# Patient Record
Sex: Male | Born: 1975 | Race: Black or African American | Hispanic: No | Marital: Married | Smoking: Never smoker
Health system: Southern US, Community
[De-identification: ages and names within clinical notes are randomized; demographics above are authoritative.]

## PROBLEM LIST (undated history)

## (undated) ENCOUNTER — Ambulatory Visit (HOSPITAL_COMMUNITY): Admission: EM | Source: Home / Self Care

## (undated) DIAGNOSIS — E785 Hyperlipidemia, unspecified: Secondary | ICD-10-CM

## (undated) HISTORY — PX: KNEE SURGERY: SHX244

## (undated) HISTORY — PX: ANKLE SURGERY: SHX546

## (undated) HISTORY — DX: Hyperlipidemia, unspecified: E78.5

---

## 2001-02-09 ENCOUNTER — Emergency Department (HOSPITAL_COMMUNITY): Admission: EM | Admit: 2001-02-09 | Discharge: 2001-02-09 | Payer: Self-pay | Admitting: *Deleted

## 2001-02-09 ENCOUNTER — Encounter: Payer: Self-pay | Admitting: Internal Medicine

## 2002-01-18 ENCOUNTER — Emergency Department (HOSPITAL_COMMUNITY): Admission: EM | Admit: 2002-01-18 | Discharge: 2002-01-19 | Payer: Self-pay | Admitting: Emergency Medicine

## 2002-01-19 ENCOUNTER — Encounter: Payer: Self-pay | Admitting: Emergency Medicine

## 2005-12-28 ENCOUNTER — Ambulatory Visit (HOSPITAL_COMMUNITY): Admission: RE | Admit: 2005-12-28 | Discharge: 2005-12-28 | Payer: Self-pay | Admitting: Family Medicine

## 2006-11-28 ENCOUNTER — Emergency Department (HOSPITAL_COMMUNITY): Admission: EM | Admit: 2006-11-28 | Discharge: 2006-11-28 | Payer: Self-pay | Admitting: Emergency Medicine

## 2010-06-25 ENCOUNTER — Emergency Department (HOSPITAL_COMMUNITY)
Admission: EM | Admit: 2010-06-25 | Discharge: 2010-06-26 | Disposition: A | Discharge status: Home / Self Care | Payer: Federal, State, Local not specified - PPO | Attending: Emergency Medicine | Admitting: Emergency Medicine

## 2010-06-25 DIAGNOSIS — IMO0002 Reserved for concepts with insufficient information to code with codable children: Secondary | ICD-10-CM | POA: Insufficient documentation

## 2010-06-25 DIAGNOSIS — X503XXA Overexertion from repetitive movements, initial encounter: Secondary | ICD-10-CM | POA: Insufficient documentation

## 2010-06-25 DIAGNOSIS — Y93B9 Activity, other involving muscle strengthening exercises: Secondary | ICD-10-CM | POA: Insufficient documentation

## 2010-06-25 LAB — URINALYSIS, ROUTINE W REFLEX MICROSCOPIC
Bilirubin Urine: NEGATIVE
Glucose, UA: NEGATIVE mg/dL
Hgb urine dipstick: NEGATIVE
Nitrite: NEGATIVE
Protein, ur: NEGATIVE mg/dL
Specific Gravity, Urine: 1.02 (ref 1.005–1.030)
Urobilinogen, UA: 0.2 mg/dL (ref 0.0–1.0)
pH: 6.5 (ref 5.0–8.0)

## 2011-07-11 ENCOUNTER — Ambulatory Visit (HOSPITAL_COMMUNITY)
Admission: RE | Admit: 2011-07-11 | Discharge: 2011-07-11 | Disposition: A | Discharge status: Home / Self Care | Source: Ambulatory Visit | Attending: Family Medicine | Admitting: Family Medicine

## 2011-07-11 ENCOUNTER — Other Ambulatory Visit (HOSPITAL_COMMUNITY): Payer: Self-pay | Admitting: Family Medicine

## 2011-07-11 DIAGNOSIS — M25561 Pain in right knee: Secondary | ICD-10-CM

## 2011-07-11 DIAGNOSIS — M25562 Pain in left knee: Secondary | ICD-10-CM

## 2011-07-11 DIAGNOSIS — M25569 Pain in unspecified knee: Secondary | ICD-10-CM | POA: Insufficient documentation

## 2011-07-26 ENCOUNTER — Ambulatory Visit (INDEPENDENT_AMBULATORY_CARE_PROVIDER_SITE_OTHER): Admitting: Orthopedic Surgery

## 2011-07-26 ENCOUNTER — Encounter: Payer: Self-pay | Admitting: Orthopedic Surgery

## 2011-07-26 VITALS — BP 106/70 | Ht 74.0 in | Wt 246.0 lb

## 2011-07-26 DIAGNOSIS — S83242A Other tear of medial meniscus, current injury, left knee, initial encounter: Secondary | ICD-10-CM

## 2011-07-26 DIAGNOSIS — M2340 Loose body in knee, unspecified knee: Secondary | ICD-10-CM

## 2011-07-26 DIAGNOSIS — IMO0002 Reserved for concepts with insufficient information to code with codable children: Secondary | ICD-10-CM

## 2011-07-26 NOTE — Progress Notes (Signed)
  Subjective:    Tony Franklin is a 36 y.o. male who presents history of gradual onset of sharp gentle 7/10. Intermittent knee pain associated with catching, locking, and giving out of the knee. The patient has had 2 episodes where the knee popped out of joint. The 1st episode started when he got up out of a chair. The 2nd episode occurred later, without any stress on the knee.  Previous treatment include Celebrex, and therapeutic exercise.  Review of systems is remarkable for joint pain with instability, muscle, pain with coughing, seasonal allergy.   The following portions of the patient's history were reviewed and updated as appropriate: allergies, current medications, past family history, past medical history, past social history, past surgical history and problem list.   Review of Systems Pertinent items are noted in HPI.   Objective:    BP 106/70  Ht 6\' 2"  (1.88 m)  Wt 246 lb (111.585 kg)  BMI 31.58 kg/m2  Physical Exam(12) GENERAL: normal development   CDV: pulses are normal   Skin: normal  Lymph: nodes were not palpable/normal  Psychiatric: awake, alert and oriented  Neuro: normal sensation  Upper extremity exam  Inspection and palpation revealed no abnormalities in the upper extremities.  Range of motion is full without contracture.  Motor exam is normal with grade 5 strength.  The joints are fully reduced without subluxation.  There is no atrophy or tremor and muscle tone is normal.  All joints are stable.     Right knee: normal and no effusion, full active range of motion, no joint line tenderness, ligamentous structures intact.  Left knee:  positive exam findings: crepitus, medial joint line tenderness and tenderness noted medial and negative exam findings: no effusion, no erythema, ACL stable, PCL stable, MCL stable, LCL stable, no patellar laxity, McMurray's negative and FROM   X-ray left knee: no fracture, dislocation, swelling or degenerative  changes noted    Assessment:    Left medial meniscal tear versus loose body    Plan:    MRI.

## 2011-07-26 NOTE — Patient Instructions (Addendum)
Schedule patient for LEFT Knee MRI diagnosis loose body versus medial meniscal tear  Start Osteo Bi-Flex as directed on package  Did not perform the PT test until knee has been evaluated by MRI and followup, plus or minus surgery has been completed

## 2011-08-04 ENCOUNTER — Telehealth: Payer: Self-pay | Admitting: Radiology

## 2011-08-04 NOTE — Telephone Encounter (Signed)
Patient has an MRI appointment at [facility] on 08-15-11 at 9:45. Patient has [employer], no precert is needed per Harmon Dun. Patient will follow up here for his results.

## 2011-08-15 ENCOUNTER — Ambulatory Visit: Admitting: Orthopedic Surgery

## 2011-11-21 ENCOUNTER — Other Ambulatory Visit (HOSPITAL_COMMUNITY): Payer: Self-pay | Admitting: Family Medicine

## 2011-11-21 ENCOUNTER — Ambulatory Visit (HOSPITAL_COMMUNITY)
Admission: RE | Admit: 2011-11-21 | Discharge: 2011-11-21 | Disposition: A | Discharge status: Home / Self Care | Source: Ambulatory Visit | Attending: Family Medicine | Admitting: Family Medicine

## 2011-11-21 DIAGNOSIS — X58XXXA Exposure to other specified factors, initial encounter: Secondary | ICD-10-CM | POA: Insufficient documentation

## 2011-11-21 DIAGNOSIS — S92919A Unspecified fracture of unspecified toe(s), initial encounter for closed fracture: Secondary | ICD-10-CM | POA: Insufficient documentation

## 2011-11-21 DIAGNOSIS — T148XXA Other injury of unspecified body region, initial encounter: Secondary | ICD-10-CM

## 2012-01-25 ENCOUNTER — Encounter: Payer: Self-pay | Admitting: Orthopedic Surgery

## 2014-05-06 ENCOUNTER — Emergency Department (HOSPITAL_COMMUNITY)
Admission: EM | Admit: 2014-05-06 | Discharge: 2014-05-06 | Disposition: A | Discharge status: Home / Self Care | Attending: Emergency Medicine | Admitting: Emergency Medicine

## 2014-05-06 ENCOUNTER — Encounter (HOSPITAL_COMMUNITY): Payer: Self-pay

## 2014-05-06 DIAGNOSIS — R1110 Vomiting, unspecified: Secondary | ICD-10-CM | POA: Diagnosis present

## 2014-05-06 DIAGNOSIS — E785 Hyperlipidemia, unspecified: Secondary | ICD-10-CM | POA: Diagnosis not present

## 2014-05-06 DIAGNOSIS — K529 Noninfective gastroenteritis and colitis, unspecified: Secondary | ICD-10-CM | POA: Insufficient documentation

## 2014-05-06 LAB — CBC WITH DIFFERENTIAL/PLATELET
BASOS ABS: 0 10*3/uL (ref 0.0–0.1)
BASOS PCT: 0 % (ref 0–1)
EOS PCT: 0 % (ref 0–5)
Eosinophils Absolute: 0 10*3/uL (ref 0.0–0.7)
HEMATOCRIT: 43 % (ref 39.0–52.0)
HEMOGLOBIN: 14.5 g/dL (ref 13.0–17.0)
LYMPHS ABS: 0.2 10*3/uL — AB (ref 0.7–4.0)
Lymphocytes Relative: 2 % — ABNORMAL LOW (ref 12–46)
MCH: 28.7 pg (ref 26.0–34.0)
MCHC: 33.7 g/dL (ref 30.0–36.0)
MCV: 85 fL (ref 78.0–100.0)
MONO ABS: 0.5 10*3/uL (ref 0.1–1.0)
Monocytes Relative: 6 % (ref 3–12)
NEUTROS PCT: 92 % — AB (ref 43–77)
Neutro Abs: 8.3 10*3/uL — ABNORMAL HIGH (ref 1.7–7.7)
Platelets: 166 10*3/uL (ref 150–400)
RBC: 5.06 MIL/uL (ref 4.22–5.81)
RDW: 13.7 % (ref 11.5–15.5)
WBC: 9.1 10*3/uL (ref 4.0–10.5)

## 2014-05-06 LAB — BASIC METABOLIC PANEL
ANION GAP: 6 (ref 5–15)
BUN: 18 mg/dL (ref 6–23)
CALCIUM: 8.9 mg/dL (ref 8.4–10.5)
CO2: 27 mmol/L (ref 19–32)
Chloride: 104 mmol/L (ref 96–112)
Creatinine, Ser: 1.16 mg/dL (ref 0.50–1.35)
GFR calc non Af Amer: 77 mL/min — ABNORMAL LOW (ref >90)
GFR, EST AFRICAN AMERICAN: 90 mL/min — AB (ref >90)
Glucose, Bld: 136 mg/dL — ABNORMAL HIGH (ref 70–99)
Potassium: 4.1 mmol/L (ref 3.5–5.1)
SODIUM: 137 mmol/L (ref 135–145)

## 2014-05-06 MED ORDER — ONDANSETRON 4 MG PO TBDP: ORAL_TABLET | ORAL | Status: AC

## 2014-05-06 MED ORDER — ONDANSETRON HCL 4 MG/2ML IJ SOLN
4.0000 mg | Freq: Once | INTRAMUSCULAR | Status: AC
  Administered 2014-05-06: 4 mg via INTRAVENOUS
  Filled 2014-05-06: qty 2

## 2014-05-06 MED ORDER — SODIUM CHLORIDE 0.9 % IV BOLUS (SEPSIS)
1000.0000 mL | Freq: Once | INTRAVENOUS | Status: AC
  Administered 2014-05-06: 1000 mL via INTRAVENOUS

## 2014-05-06 MED ORDER — SIMETHICONE 40 MG/0.6ML PO SUSP
ORAL | Status: AC
  Filled 2014-05-06: qty 1.8

## 2014-05-06 MED ORDER — KETOROLAC TROMETHAMINE 30 MG/ML IJ SOLN
30.0000 mg | Freq: Once | INTRAMUSCULAR | Status: AC
  Administered 2014-05-06: 30 mg via INTRAVENOUS
  Filled 2014-05-06: qty 1

## 2014-05-06 NOTE — ED Provider Notes (Signed)
CSN: [id number]     Arrival date & time 05/06/14  0510 History   First MD Initiated Contact with Patient 05/06/14 [phone]     Chief Complaint  Patient presents with  . Emesis     (Consider location/radiation/quality/duration/timing/severity/associated sxs/prior Treatment) Patient is a 39 y.o. male presenting with vomiting. The history is provided by the patient (pt complains of vomiting,  diarrhea and abd cramps for one day).  Emesis Severity:  Moderate Timing:  Intermittent Quality:  Bilious material Able to tolerate:  Liquids Progression:  Unchanged Chronicity:  New Recent urination:  Normal Context: not post-tussive   Associated symptoms: abdominal pain and diarrhea   Associated symptoms: no headaches     Past Medical History  Diagnosis Date  . Hyperlipidemia    Past Surgical History  Procedure Laterality Date  . Ankle surgery     Family History  Problem Relation Age of Onset  . Heart disease    . Arthritis    . Cancer     History  Substance Use Topics  . Smoking status: Never Smoker   . Smokeless tobacco: Not on file  . Alcohol Use: No    Review of Systems  Constitutional: Negative for appetite change and fatigue.  HENT: Negative for congestion, ear discharge and sinus pressure.   Eyes: Negative for discharge.  Respiratory: Negative for cough.   Cardiovascular: Negative for chest pain.  Gastrointestinal: Positive for vomiting, abdominal pain and diarrhea.  Genitourinary: Negative for frequency and hematuria.  Musculoskeletal: Negative for back pain.  Skin: Negative for rash.  Neurological: Negative for seizures and headaches.  Psychiatric/Behavioral: Negative for hallucinations.      Allergies  Review of patient's allergies indicates no known allergies.  Home Medications   Prior to Admission medications   Medication Sig Start Date End Date Taking? Authorizing Provider  Rosuvastatin Calcium (CRESTOR PO) Take by mouth.   Yes Historical Provider, MD   Celecoxib (CELEBREX PO) Take by mouth.    Historical Provider, MD  ondansetron (ZOFRAN ODT) 4 MG disintegrating tablet  ODT q4 hours prn nausea/vomit 05/06/14   Benny Lennert, MD   BP 125/82 mmHg  Pulse 97  Temp(Src) 98.3 F (36.8 C) (Oral)  Resp 20  Ht  (1.905 m)  Wt 250 lb (113.399 kg)  BMI 31.25 kg/m2  SpO2 95% Physical Exam  Constitutional: He is oriented to person, place, and time. He appears well-developed.  HENT:  Head: Normocephalic.  Eyes: Conjunctivae and EOM are normal. No scleral icterus.  Neck: Neck supple. No thyromegaly present.  Cardiovascular: Normal rate and regular rhythm.  Exam reveals no gallop and no friction rub.   No murmur heard. Pulmonary/Chest: No stridor. He has no wheezes. He has no rales. He exhibits no tenderness.  Abdominal: He exhibits no distension. There is tenderness. There is no rebound.  Mild abd tender  Musculoskeletal: Normal range of motion. He exhibits no edema.  Lymphadenopathy:    He has no cervical adenopathy.  Neurological: He is oriented to person, place, and time. He exhibits normal muscle tone. Coordination normal.  Skin: No rash noted. No erythema.  Psychiatric: He has a normal mood and affect. His behavior is normal.    ED Course  Procedures (including critical care time) Labs Review Labs Reviewed  CBC WITH DIFFERENTIAL/PLATELET - Abnormal; Notable for the following:    Neutrophils Relative % 92 (*)    Neutro Abs 8.3 (*)    Lymphocytes Relative 2 (*)    Lymphs Abs 0.2 (*)  All other components within normal limits  BASIC METABOLIC PANEL - Abnormal; Notable for the following:    Glucose, Bld 136 (*)    GFR calc non Af Amer 77 (*)    GFR calc Af Amer 90 (*)    All other components within normal limits    Imaging Review No results found.   EKG Interpretation None      MDM   Final diagnoses:  Gastroenteritis    Gastroenteritis,   tx with zofran, imodium, tylenol and follow up    Benny LennertJoseph L  Sayana Salley, MD 05/06/14 [phone]

## 2014-05-06 NOTE — ED Notes (Signed)
Pt states his son had a stomach bug and he thinks he may have it now, vomiting and diarrhea x2

## 2014-05-06 NOTE — Discharge Instructions (Signed)
Drink plenty of fluids.   Tylenol or motrin for abdominal cramps.  Imodium for severe diarrhea.  Follow up as needed

## 2015-03-16 ENCOUNTER — Encounter (HOSPITAL_COMMUNITY): Payer: Self-pay | Admitting: *Deleted

## 2015-03-16 ENCOUNTER — Emergency Department (HOSPITAL_COMMUNITY)
Admission: EM | Admit: 2015-03-16 | Discharge: 2015-03-17 | Disposition: A | Discharge status: Home / Self Care | Attending: Emergency Medicine | Admitting: Emergency Medicine

## 2015-03-16 DIAGNOSIS — E785 Hyperlipidemia, unspecified: Secondary | ICD-10-CM | POA: Insufficient documentation

## 2015-03-16 DIAGNOSIS — R109 Unspecified abdominal pain: Secondary | ICD-10-CM | POA: Insufficient documentation

## 2015-03-16 DIAGNOSIS — R110 Nausea: Secondary | ICD-10-CM | POA: Diagnosis not present

## 2015-03-16 LAB — CBC WITH DIFFERENTIAL/PLATELET
BASOS PCT: 0 %
Basophils Absolute: 0 10*3/uL (ref 0.0–0.1)
Eosinophils Absolute: 0 10*3/uL (ref 0.0–0.7)
Eosinophils Relative: 0 %
HEMATOCRIT: 41.6 % (ref 39.0–52.0)
HEMOGLOBIN: 14 g/dL (ref 13.0–17.0)
LYMPHS ABS: 1.5 10*3/uL (ref 0.7–4.0)
Lymphocytes Relative: 22 %
MCH: 28.4 pg (ref 26.0–34.0)
MCHC: 33.7 g/dL (ref 30.0–36.0)
MCV: 84.4 fL (ref 78.0–100.0)
MONOS PCT: 6 %
Monocytes Absolute: 0.4 10*3/uL (ref 0.1–1.0)
NEUTROS ABS: 5 10*3/uL (ref 1.7–7.7)
Neutrophils Relative %: 72 %
Platelets: 194 10*3/uL (ref 150–400)
RBC: 4.93 MIL/uL (ref 4.22–5.81)
RDW: 13.6 % (ref 11.5–15.5)
WBC: 7 10*3/uL (ref 4.0–10.5)

## 2015-03-16 MED ORDER — FAMOTIDINE 20 MG PO TABS
20.0000 mg | ORAL_TABLET | Freq: Once | ORAL | Status: AC
  Administered 2015-03-16: 20 mg via ORAL
  Filled 2015-03-16: qty 1

## 2015-03-16 MED ORDER — ONDANSETRON 4 MG PO TBDP
4.0000 mg | ORAL_TABLET | Freq: Once | ORAL | Status: AC
  Administered 2015-03-16: 4 mg via ORAL
  Filled 2015-03-16: qty 1

## 2015-03-16 NOTE — ED Provider Notes (Signed)
CSN: [id number]     Arrival date & time 03/16/15  2228 History   First MD Initiated Contact with Patient 03/16/15 2311     Chief Complaint  Patient presents with  . Abdominal Cramping     (Consider location/radiation/quality/duration/timing/severity/associated sxs/prior Treatment) Patient is a 39 y.o. male presenting with cramps. The history is provided by the patient.  Abdominal Cramping This is a new problem. The current episode started today. The problem occurs intermittently. The problem has been gradually worsening. Associated symptoms include abdominal pain and nausea. Pertinent negatives include no chills, fatigue, fever or headaches. Exacerbated by: home made candy. He has tried nothing for the symptoms. The treatment provided no relief.    Past Medical History  Diagnosis Date  . Hyperlipidemia    Past Surgical History  Procedure Laterality Date  . Ankle surgery     Family History  Problem Relation Age of Onset  . Heart disease    . Arthritis    . Cancer     Social History  Substance Use Topics  . Smoking status: Never Smoker   . Smokeless tobacco: None  . Alcohol Use: No    Review of Systems  Constitutional: Negative for fever, chills and fatigue.  Gastrointestinal: Positive for nausea and abdominal pain.  Neurological: Negative for headaches.  All other systems reviewed and are negative.     Allergies  Review of patient's allergies indicates no known allergies.  Home Medications   Prior to Admission medications   Medication Sig Start Date End Date Taking? Authorizing Provider  Celecoxib (CELEBREX PO) Take by mouth.    Historical Provider, MD  ondansetron (ZOFRAN ODT) 4 MG disintegrating tablet 4mg  ODT q4 hours prn nausea/vomit 05/06/14   Bethann BerkshireJoseph Zammit, MD  Rosuvastatin Calcium (CRESTOR PO) Take by mouth.    Historical Provider, MD   BP 138/92 mmHg  Pulse 63  Temp(Src) 97.9 F (36.6 C) (Oral)  Resp 18  Ht 6\' 3"  (1.905 m)  Wt 117.935 kg  BMI 32.50  kg/m2  SpO2 100% Physical Exam  Constitutional: He is oriented to person, place, and time. He appears well-developed and well-nourished.  Non-toxic appearance.  HENT:  Head: Normocephalic.  Right Ear: Tympanic membrane and external ear normal.  Left Ear: Tympanic membrane and external ear normal.  Eyes: EOM and lids are normal. Pupils are equal, round, and reactive to light.  Neck: Normal range of motion. Neck supple. Carotid bruit is not present.  Cardiovascular: Normal rate, regular rhythm, normal heart sounds, intact distal pulses and normal pulses.   Pulmonary/Chest: Breath sounds normal. No respiratory distress.  Abdominal: Soft. Bowel sounds are normal. There is no tenderness. There is no guarding.  Abdominal soreness present. No distention. No Mass. No CVAT.  Musculoskeletal: Normal range of motion.  Lymphadenopathy:       Head (right side): No submandibular adenopathy present.       Head (left side): No submandibular adenopathy present.    He has no cervical adenopathy.  Neurological: He is alert and oriented to person, place, and time. He has normal strength. No cranial nerve deficit or sensory deficit.  Skin: Skin is warm and dry.  Psychiatric: He has a normal mood and affect. His speech is normal.  Nursing note and vitals reviewed.   ED Course  Procedures (including critical care time) Labs Review Labs Reviewed - No data to display  Imaging Review No results found. I have personally reviewed and evaluated these images and lab results as part of my  medical decision-making.   EKG Interpretation None      MDM  Vital signs are well within normal limits. The complete blood count is well within normal limits. The competence of metabolic panel is nonacute. The lipase is also normal. Patient is feeling some better after Pepcid and Zofran. I have reviewed the findings on examination as well as the laboratory findings with the patient in terms which he understands.  Prescription for Zofran, Pepcid, and Protonix given to the patient. The patient is to follow-up with the GI specialist as soon as possible for additional evaluation concerning his stomach discomfort. Patient is in agreement with this discharge plan.    Final diagnoses:  None    *I have reviewed nursing notes, vital signs, and all appropriate lab and imaging results for this patient.[address] PA-C 03/18/15 1821  Dione Booze, MD 03/22/15 1455

## 2015-03-16 NOTE — ED Notes (Signed)
Pt reporting abdominal cramping that began about 10 this morning.  Reporting some nausea, no vomiting.  Denies diarrhea. Reports that he believes that he got food poisoning.

## 2015-03-17 LAB — COMPREHENSIVE METABOLIC PANEL
ALBUMIN: 4.3 g/dL (ref 3.5–5.0)
ALK PHOS: 60 U/L (ref 38–126)
ALT: 19 U/L (ref 17–63)
ANION GAP: 9 (ref 5–15)
AST: 25 U/L (ref 15–41)
BILIRUBIN TOTAL: 1.2 mg/dL (ref 0.3–1.2)
BUN: 15 mg/dL (ref 6–20)
CALCIUM: 9.4 mg/dL (ref 8.9–10.3)
CO2: 27 mmol/L (ref 22–32)
Chloride: 103 mmol/L (ref 101–111)
Creatinine, Ser: 1 mg/dL (ref 0.61–1.24)
GFR calc Af Amer: >60 mL/min (ref >60)
GFR calc non Af Amer: >60 mL/min (ref >60)
GLUCOSE: 123 mg/dL — AB (ref 65–99)
Potassium: 3.9 mmol/L (ref 3.5–5.1)
Sodium: 139 mmol/L (ref 135–145)
TOTAL PROTEIN: 7.9 g/dL (ref 6.5–8.1)

## 2015-03-17 LAB — LIPASE, BLOOD: Lipase: 25 U/L (ref 11–51)

## 2015-03-17 MED ORDER — ONDANSETRON HCL 4 MG PO TABS: 4.0000 mg | ORAL_TABLET | Freq: Four times a day (QID) | ORAL | Status: DC

## 2015-03-17 MED ORDER — FAMOTIDINE 40 MG PO TABS: 40.0000 mg | ORAL_TABLET | Freq: Every day | ORAL | Status: AC

## 2015-03-17 MED ORDER — PANTOPRAZOLE SODIUM 20 MG PO TBEC: 20.0000 mg | DELAYED_RELEASE_TABLET | Freq: Every day | ORAL | Status: AC

## 2015-03-17 NOTE — Discharge Instructions (Signed)

## 2017-06-13 ENCOUNTER — Other Ambulatory Visit (HOSPITAL_COMMUNITY): Payer: Self-pay | Admitting: Family Medicine

## 2017-06-13 ENCOUNTER — Ambulatory Visit (HOSPITAL_COMMUNITY)
Admission: RE | Admit: 2017-06-13 | Discharge: 2017-06-13 | Disposition: A | Discharge status: Home / Self Care | Source: Ambulatory Visit | Attending: Family Medicine | Admitting: Family Medicine

## 2017-06-13 DIAGNOSIS — G8929 Other chronic pain: Secondary | ICD-10-CM

## 2017-06-13 DIAGNOSIS — M25572 Pain in left ankle and joints of left foot: Secondary | ICD-10-CM | POA: Insufficient documentation

## 2018-06-13 ENCOUNTER — Ambulatory Visit (HOSPITAL_COMMUNITY)
Admission: RE | Admit: 2018-06-13 | Discharge: 2018-06-13 | Disposition: A | Discharge status: Home / Self Care | Source: Ambulatory Visit | Attending: Family Medicine | Admitting: Family Medicine

## 2018-06-13 ENCOUNTER — Other Ambulatory Visit (HOSPITAL_COMMUNITY): Payer: Self-pay | Admitting: Family Medicine

## 2018-06-13 ENCOUNTER — Other Ambulatory Visit: Payer: Self-pay

## 2018-06-13 DIAGNOSIS — T148XXA Other injury of unspecified body region, initial encounter: Secondary | ICD-10-CM | POA: Insufficient documentation

## 2019-03-06 ENCOUNTER — Other Ambulatory Visit: Payer: Self-pay

## 2019-03-06 ENCOUNTER — Emergency Department (HOSPITAL_COMMUNITY)

## 2019-03-06 ENCOUNTER — Emergency Department (HOSPITAL_COMMUNITY)
Admission: EM | Admit: 2019-03-06 | Discharge: 2019-03-06 | Disposition: A | Discharge status: Home / Self Care | Attending: Emergency Medicine | Admitting: Emergency Medicine

## 2019-03-06 ENCOUNTER — Encounter (HOSPITAL_COMMUNITY): Payer: Self-pay | Admitting: *Deleted

## 2019-03-06 DIAGNOSIS — Z79899 Other long term (current) drug therapy: Secondary | ICD-10-CM | POA: Insufficient documentation

## 2019-03-06 DIAGNOSIS — Z20828 Contact with and (suspected) exposure to other viral communicable diseases: Secondary | ICD-10-CM | POA: Insufficient documentation

## 2019-03-06 DIAGNOSIS — R110 Nausea: Secondary | ICD-10-CM | POA: Diagnosis present

## 2019-03-06 DIAGNOSIS — J189 Pneumonia, unspecified organism: Secondary | ICD-10-CM

## 2019-03-06 DIAGNOSIS — Z20822 Contact with and (suspected) exposure to covid-19: Secondary | ICD-10-CM

## 2019-03-06 DIAGNOSIS — J181 Lobar pneumonia, unspecified organism: Secondary | ICD-10-CM | POA: Insufficient documentation

## 2019-03-06 LAB — CBC WITH DIFFERENTIAL/PLATELET
Abs Immature Granulocytes: 0.02 10*3/uL (ref 0.00–0.07)
Basophils Absolute: 0 10*3/uL (ref 0.0–0.1)
Basophils Relative: 0 %
Eosinophils Absolute: 0 10*3/uL (ref 0.0–0.5)
Eosinophils Relative: 0 %
HCT: 41.4 % (ref 39.0–52.0)
Hemoglobin: 13.5 g/dL (ref 13.0–17.0)
Immature Granulocytes: 0 %
Lymphocytes Relative: 6 %
Lymphs Abs: 0.5 10*3/uL — ABNORMAL LOW (ref 0.7–4.0)
MCH: 27.8 pg (ref 26.0–34.0)
MCHC: 32.6 g/dL (ref 30.0–36.0)
MCV: 85.4 fL (ref 80.0–100.0)
Monocytes Absolute: 0.5 10*3/uL (ref 0.1–1.0)
Monocytes Relative: 6 %
Neutro Abs: 6.8 10*3/uL (ref 1.7–7.7)
Neutrophils Relative %: 88 %
Platelets: 178 10*3/uL (ref 150–400)
RBC: 4.85 MIL/uL (ref 4.22–5.81)
RDW: 13.4 % (ref 11.5–15.5)
WBC: 7.7 10*3/uL (ref 4.0–10.5)
nRBC: 0 % (ref 0.0–0.2)

## 2019-03-06 LAB — URINALYSIS, ROUTINE W REFLEX MICROSCOPIC
Bilirubin Urine: NEGATIVE
Glucose, UA: NEGATIVE mg/dL
Hgb urine dipstick: NEGATIVE
Ketones, ur: NEGATIVE mg/dL
Leukocytes,Ua: NEGATIVE
Nitrite: NEGATIVE
Protein, ur: NEGATIVE mg/dL
Specific Gravity, Urine: 1.021 (ref 1.005–1.030)
pH: 6 (ref 5.0–8.0)

## 2019-03-06 LAB — COMPREHENSIVE METABOLIC PANEL
ALT: 20 U/L (ref 0–44)
AST: 27 U/L (ref 15–41)
Albumin: 3.9 g/dL (ref 3.5–5.0)
Alkaline Phosphatase: 56 U/L (ref 38–126)
Anion gap: 11 (ref 5–15)
BUN: 11 mg/dL (ref 6–20)
CO2: 24 mmol/L (ref 22–32)
Calcium: 8.2 mg/dL — ABNORMAL LOW (ref 8.9–10.3)
Chloride: 95 mmol/L — ABNORMAL LOW (ref 98–111)
Creatinine, Ser: 1.01 mg/dL (ref 0.61–1.24)
GFR calc Af Amer: >60 mL/min (ref >60)
GFR calc non Af Amer: >60 mL/min (ref >60)
Glucose, Bld: 99 mg/dL (ref 70–99)
Potassium: 3.3 mmol/L — ABNORMAL LOW (ref 3.5–5.1)
Sodium: 130 mmol/L — ABNORMAL LOW (ref 135–145)
Total Bilirubin: 2.2 mg/dL — ABNORMAL HIGH (ref 0.3–1.2)
Total Protein: 7.9 g/dL (ref 6.5–8.1)

## 2019-03-06 LAB — LIPASE, BLOOD: Lipase: 19 U/L (ref 11–51)

## 2019-03-06 MED ORDER — ONDANSETRON HCL 4 MG PO TABS: 4.0000 mg | ORAL_TABLET | Freq: Four times a day (QID) | ORAL | 0 refills | Status: AC

## 2019-03-06 MED ORDER — ONDANSETRON HCL 4 MG/2ML IJ SOLN
4.0000 mg | Freq: Once | INTRAMUSCULAR | Status: AC
  Administered 2019-03-06: 4 mg via INTRAVENOUS
  Filled 2019-03-06: qty 2

## 2019-03-06 MED ORDER — AMOXICILLIN 500 MG PO CAPS: 1000.0000 mg | ORAL_CAPSULE | Freq: Three times a day (TID) | ORAL | 0 refills | Status: AC

## 2019-03-06 MED ORDER — BENZONATATE 100 MG PO CAPS: 100.0000 mg | ORAL_CAPSULE | Freq: Three times a day (TID) | ORAL | 0 refills | Status: AC

## 2019-03-06 MED ORDER — AZITHROMYCIN 250 MG PO TABS: ORAL_TABLET | ORAL | 0 refills | Status: AC

## 2019-03-06 MED ORDER — POTASSIUM CHLORIDE 20 MEQ PO PACK
40.0000 meq | PACK | Freq: Once | ORAL | Status: AC
  Administered 2019-03-06: 40 meq via ORAL
  Filled 2019-03-06: qty 2

## 2019-03-06 MED ORDER — ONDANSETRON HCL 4 MG/2ML IJ SOLN
INTRAMUSCULAR | Status: AC
  Filled 2019-03-06: qty 2

## 2019-03-06 MED ORDER — ACETAMINOPHEN 325 MG PO TABS
650.0000 mg | ORAL_TABLET | Freq: Once | ORAL | Status: AC
  Administered 2019-03-06: 650 mg via ORAL
  Filled 2019-03-06: qty 2

## 2019-03-06 MED ORDER — ACETAMINOPHEN 325 MG PO TABS
325.0000 mg | ORAL_TABLET | Freq: Once | ORAL | Status: AC
  Administered 2019-03-06: 325 mg via ORAL

## 2019-03-06 NOTE — ED Triage Notes (Signed)
Pt with generalized abd cramping and muscle cramps starting today.  Pt with nausea, denies emesis or diarrhea.

## 2019-03-06 NOTE — ED Provider Notes (Signed)
[facility] Provider Note   CSN: [id number] Arrival date & time: 03/06/19  1435     History   Chief Complaint Chief Complaint  Patient presents with  . Abdominal Pain    HPI Tony Franklin is a 43 y.o. male.     Patient is a 43 year old male with no significant past medical history presenting to the emergency department for nausea and fever.  Patient reports that this morning he began to feel some abdominal cramping associated with nausea without any vomiting.  Also reports that he has been having a cough for a few months.  Reports some postnasal drip but otherwise no URI symptoms.  Reports when he coughs he gets a headache.  He has not tried anything for relief.  Reports not eating and drinking very well in the last few days.  No known exposure to Covid.  No abdominal pain, chest vague cramping.  Reports urinary and bowel habits have been normal     Past Medical History:  Diagnosis Date  . Hyperlipidemia     Patient Active Problem List   Diagnosis Date Noted  . Acute medial meniscus tear of left knee 07/26/2011  . Loose body in knee 07/26/2011    Past Surgical History:  Procedure Laterality Date  . ANKLE SURGERY    . KNEE SURGERY          Home Medications    Prior to Admission medications   Medication Sig Start Date End Date Taking? Authorizing Provider  amoxicillin (AMOXIL) 500 MG capsule Take 2 capsules (1,000 mg total) by mouth 3 (three) times daily for 7 days. 03/06/19 03/13/19  Ronnie DossMcLean, Sora Olivo A, PA-C  azithromycin (ZITHROMAX) 250 MG tablet Zpack instructions. 500mg  day 1and 250mg  each day after until gone 03/06/19   Ronnie DossMcLean, Sly Parlee A, PA-C  benzonatate (TESSALON) 100 MG capsule Take 1 capsule (100 mg total) by mouth every 8 (eight) hours. 03/06/19   Ronnie DossMcLean, Taj Nevins A, PA-C  Celecoxib (CELEBREX PO) Take by mouth.    [provider]  famotidine (PEPCID) 40 MG tablet Take 1 tablet (40 mg total) by mouth daily. 03/17/15   Ivery QualeBryant,  Hobson, PA-C  ondansetron (ZOFRAN ODT) 4 MG disintegrating tablet 4mg  ODT q4 hours prn nausea/vomit 05/06/14   Bethann BerkshireZammit, Joseph, MD  ondansetron (ZOFRAN) 4 MG tablet Take 1 tablet (4 mg total) by mouth every 6 (six) hours. 03/06/19   Arlyn DunningMcLean, Loveta Dellis A, PA-C  pantoprazole (PROTONIX) 20 MG tablet Take 1 tablet (20 mg total) by mouth daily. 03/17/15   Ivery QualeBryant, Hobson, PA-C  Rosuvastatin Calcium (CRESTOR PO) Take by mouth.    [provider]    Family History Family History  Problem Relation Age of Onset  . Heart disease Other   . Arthritis Other   . Cancer Other     Social History Social History   Tobacco Use  . Smoking status: Never Smoker  . Smokeless tobacco: Never Used  Substance Use Topics  . Alcohol use: No  . Drug use: No     Allergies   Patient has no known allergies.   Review of Systems Review of Systems  Constitutional: Positive for fever. Negative for activity change, chills, diaphoresis and fatigue.  HENT: Positive for postnasal drip. Negative for congestion, ear pain, sinus pain and sore throat.   Respiratory: Positive for cough. Negative for shortness of breath, wheezing and stridor.   Cardiovascular: Negative for chest pain.  Gastrointestinal: Positive for nausea. Negative for abdominal distention, abdominal pain, anal bleeding,  blood in stool, constipation, diarrhea and vomiting.  Genitourinary: Negative for dysuria.  Musculoskeletal: Negative for back pain.  Skin: Negative for rash and wound.  Neurological: Positive for headaches. Negative for dizziness and light-headedness.     Physical Exam Updated Vital Signs BP 129/61 (BP Location: Right Arm)   Pulse (!) 109   Temp (!) 100.4 F (38 C) (Oral)   Resp 18   Ht 6\' 2"  (1.88 m)   Wt 113.4 kg   SpO2 96%   BMI 32.10 kg/m   Physical Exam Vitals signs and nursing note reviewed.  Constitutional:      General: He is not in acute distress.    Appearance: Normal appearance. He is well-developed. He is  not ill-appearing, toxic-appearing or diaphoretic.  HENT:     Head: Normocephalic.  Eyes:     Extraocular Movements: Extraocular movements intact.     Conjunctiva/sclera: Conjunctivae normal.  Cardiovascular:     Rate and Rhythm: Normal rate and regular rhythm.  Pulmonary:     Effort: Pulmonary effort is normal.  Abdominal:     General: Bowel sounds are normal.     Tenderness: There is no abdominal tenderness.  Skin:    General: Skin is warm and dry.  Neurological:     General: No focal deficit present.     Mental Status: He is alert.  Psychiatric:        Mood and Affect: Mood normal.      ED Treatments / Results  Labs (all labs ordered are listed, but only abnormal results are displayed) Labs Reviewed  CBC WITH DIFFERENTIAL/PLATELET - Abnormal; Notable for the following components:      Result Value   Lymphs Abs 0.5 (*)    All other components within normal limits  COMPREHENSIVE METABOLIC PANEL - Abnormal; Notable for the following components:   Sodium 130 (*)    Potassium 3.3 (*)    Chloride 95 (*)    Calcium 8.2 (*)    Total Bilirubin 2.2 (*)    All other components within normal limits  SARS CORONAVIRUS 2 (TAT 6-24 HRS)  LIPASE, BLOOD  URINALYSIS, ROUTINE W REFLEX MICROSCOPIC    EKG None  Radiology Dg Chest Portable 1 View  Result Date: 03/06/2019 CLINICAL DATA:  Fever and cough EXAM: PORTABLE CHEST 1 VIEW COMPARISON:  None. FINDINGS: There is patchy airspace opacity in the right base. There is slight left base atelectasis. Lungs elsewhere are clear. Heart is upper normal in size with pulmonary vascularity normal. No adenopathy. No bone lesions. IMPRESSION: Patchy airspace opacity consistent with pneumonia right base. Atelectasis left base. Lungs elsewhere clear. Heart upper normal in size. No evident adenopathy. Electronically Signed   By: 14/06/2018 III M.D.   On: 03/06/2019 16:28    Procedures Procedures (including critical care time)  Medications  Ordered in ED Medications  acetaminophen (TYLENOL) tablet 650 mg (650 mg Oral Given 03/06/19 1555)  ondansetron (ZOFRAN) injection 4 mg (4 mg Intravenous Given 03/06/19 1617)  acetaminophen (TYLENOL) tablet 325 mg (325 mg Oral Given 03/06/19 1626)  potassium chloride (KLOR-CON) packet 40 mEq (40 mEq Oral Given 03/06/19 1628)     Initial Impression / Assessment and Plan / ED Course  I have reviewed the triage vital signs and the nursing notes.  Pertinent labs & imaging results that were available during my care of the patient were reviewed by me and considered in my medical decision making (see chart for details).  Clinical Course as of Dec 03  [place]  Thu Mar 06, 2019  1634 Patient presenting with 1 days of headache, nausea, belly cramping and cough. Otherwise appears healthy and stable. Workup revealing mild hypokalemia 3.3, hyponatreima 130 but normal white count. Chest xray consistent with RLL pneumonia. Covid 19 considered and test pending. He is not hypoxic and he does not meet admission criteria. Will treat for CAP but advised patient he likely has covid and needs to quarantine. Will start him on abx in the case this is CAP not caused by covid. Advised on strict return precautions.    [KM]    Clinical Course User Index [KM] Alveria Apley, PA-C       Based on review of vitals, medical screening exam, lab work and/or imaging, there does not appear to be an acute, emergent etiology for the patient's symptoms. Counseled pt on good return precautions and encouraged both PCP and ED follow-up as needed.  Prior to discharge, I also discussed incidental imaging findings with patient in detail and advised appropriate, recommended follow-up in detail.  Clinical Impression: 1. Nausea   2. Pneumonia of right lower lobe due to infectious organism   3. Suspected COVID-19 virus infection     Disposition: Discharge  Prior to providing a prescription for a controlled substance, I independently  reviewed the patient's recent prescription history on the [place]. The patient had no recent or regular prescriptions and was deemed appropriate for a brief, less than 3 day prescription of narcotic for acute analgesia.  This note was prepared with assistance of Systems analyst. Occasional wrong-word or sound-a-like substitutions may have occurred due to the inherent limitations of voice recognition software.   Final Clinical Impressions(s) / ED Diagnoses   Final diagnoses:  Nausea  Pneumonia of right lower lobe due to infectious organism  Suspected COVID-19 virus infection    ED Discharge Orders         Ordered    amoxicillin (AMOXIL) 500 MG capsule  3 times daily     03/06/19 1656    azithromycin (ZITHROMAX) 250 MG tablet     03/06/19 1656    benzonatate (TESSALON) 100 MG capsule  Every 8 hours     03/06/19 1656    ondansetron (ZOFRAN) 4 MG tablet  Every 6 hours     03/06/19 1656           Kristine Royal 03/06/19 1657    Isla Pence, MD 03/06/19 1702

## 2019-03-06 NOTE — Discharge Instructions (Addendum)
You are seen today for cough and nausea.  Your CBC was normal.  Your metabolic panel showed that your potassium and sodium were just very low.  This will be improved with good diet.  Your chest x-ray showed that there is a small pneumonia on the right side.  It is difficult to tell if this pneumonia is from a bacteria which will require antibiotics or if this pneumonia is from COVID-19 which will improve on its own.  We are going to be on the safe side and start you on antibiotics.  I have also sent medications for cough and nausea to the pharmacy.  You need to stay at home and quarantine yourself for at least 10 days from symptom onset and until you have not had a fever for greater than 72 hours.  If you feel breathless or are unable to keep any fluids down then please return to the emergency department.  Otherwise stay at home and do not leave your home until you meet the above criteria.

## 2019-03-07 LAB — SARS CORONAVIRUS 2 (TAT 6-24 HRS): SARS Coronavirus 2: NEGATIVE

## 2021-03-04 IMAGING — CR RIGHT HAND - COMPLETE 3+ VIEW
3 series · 3 of 3 positions shown · non-contrast
Comparison: None.

CLINICAL DATA: Pt states he dropped a weight onto right hand 2
weeks ago/pain 2nd and 3rd metacarpal/no surgery

EXAM:
RIGHT HAND - COMPLETE 3+ VIEW

[x hand pa right]
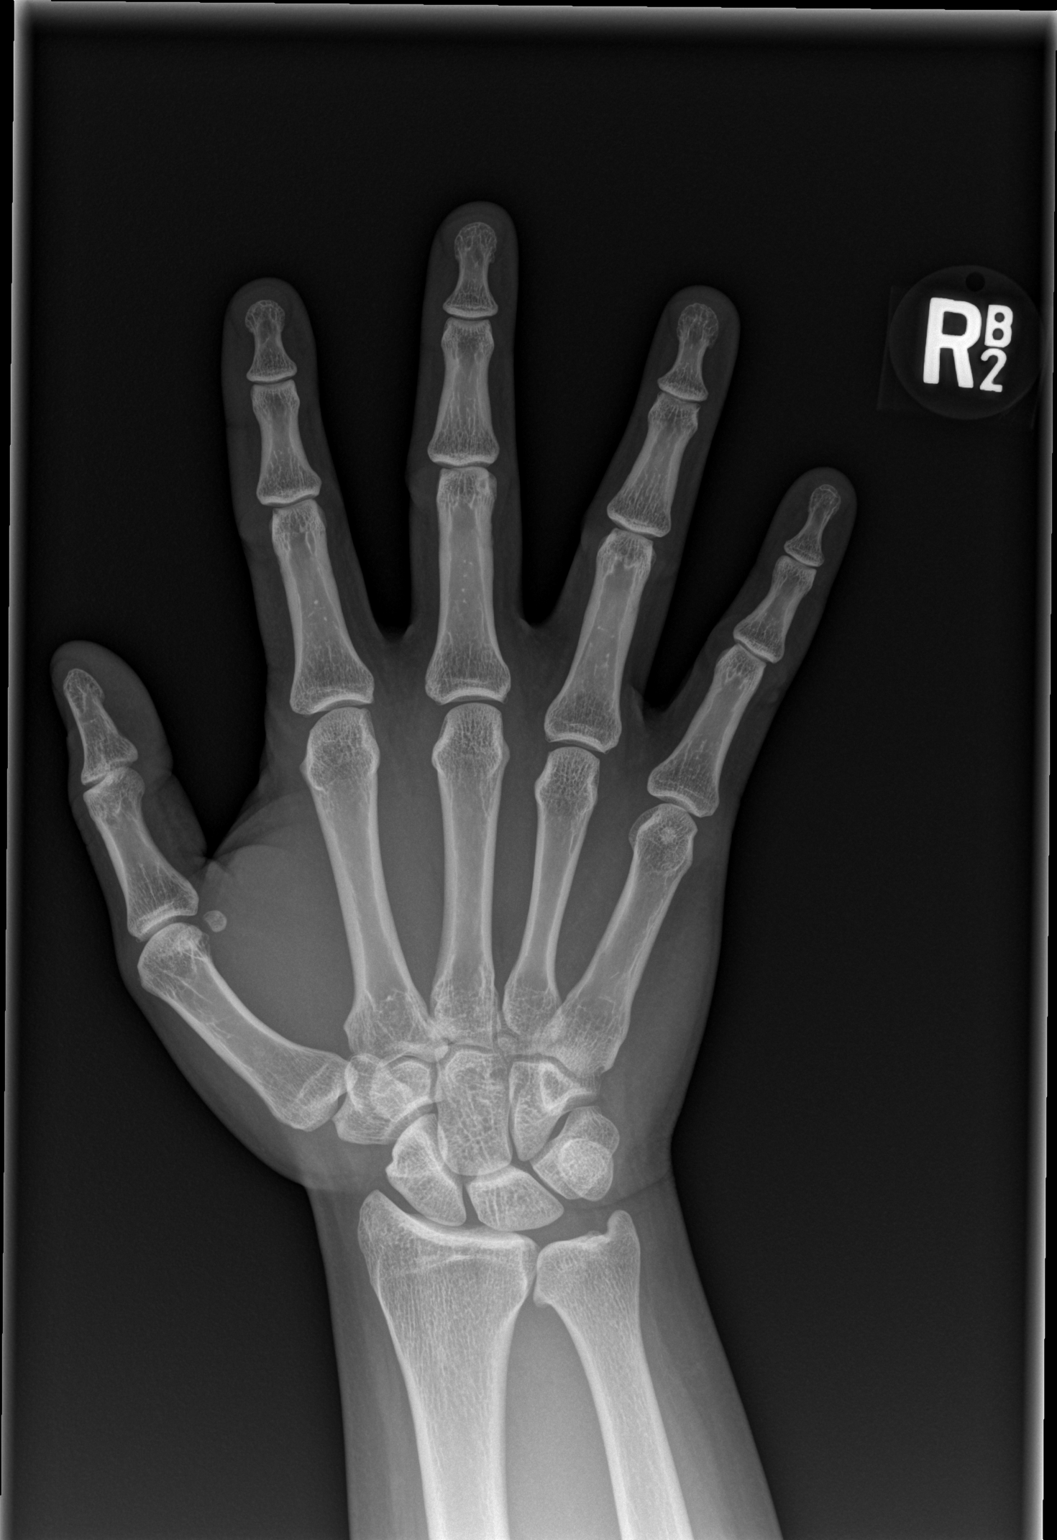

[x hand obl right]
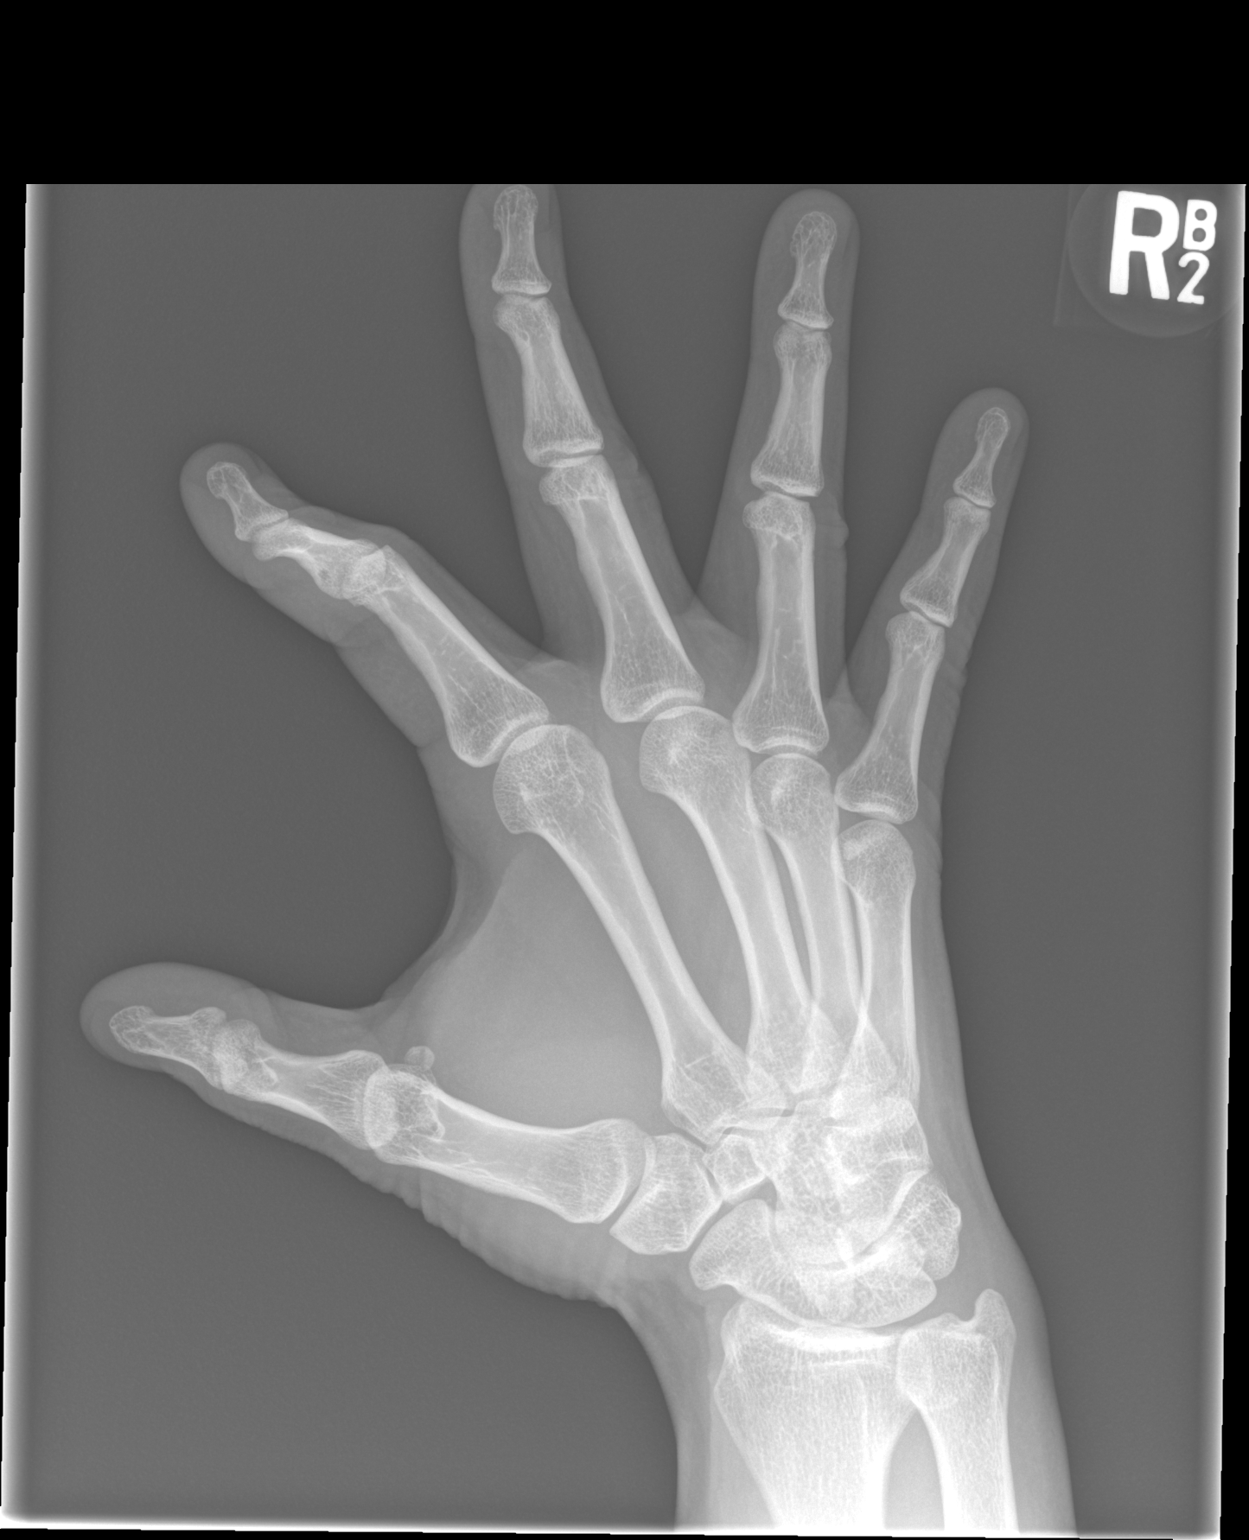

[x hand lat right]
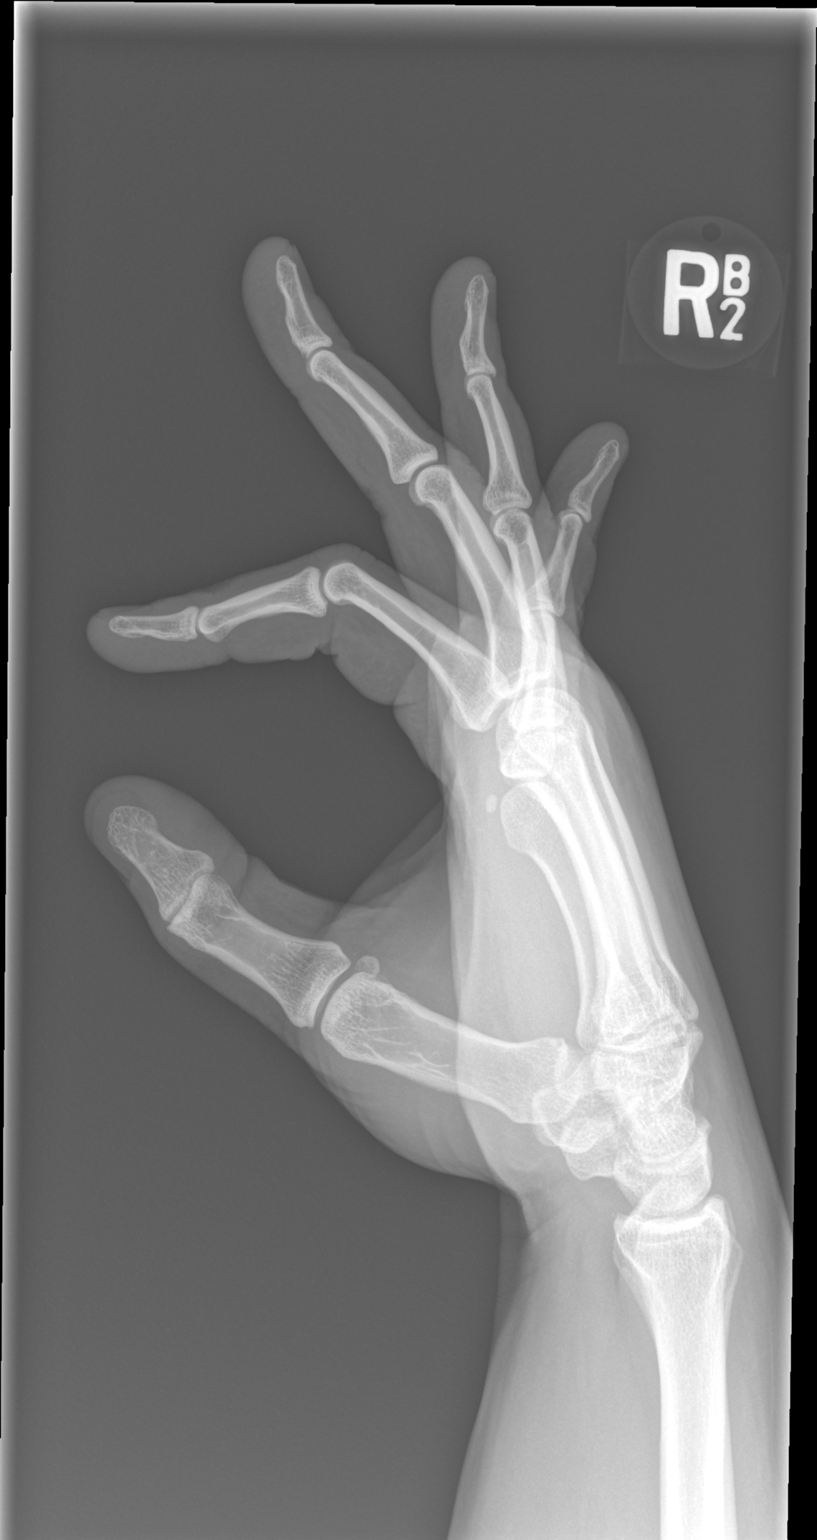

[3 of 3 positions shown; findings below may reference images not displayed]

FINDINGS: There is no evidence of fracture or dislocation. There is no
evidence of arthropathy or other focal bone abnormality. Soft
tissues are unremarkable.
IMPRESSION: Negative.

## 2021-03-14 ENCOUNTER — Ambulatory Visit
Admission: EM | Admit: 2021-03-14 | Discharge: 2021-03-14 | Disposition: A | Discharge status: Home / Self Care | Attending: Family Medicine | Admitting: Family Medicine

## 2021-03-14 ENCOUNTER — Other Ambulatory Visit: Payer: Self-pay

## 2021-03-14 DIAGNOSIS — J069 Acute upper respiratory infection, unspecified: Secondary | ICD-10-CM

## 2021-03-14 MED ORDER — PROMETHAZINE-DM 6.25-15 MG/5ML PO SYRP: 5.0000 mL | ORAL_SOLUTION | Freq: Four times a day (QID) | ORAL | 0 refills | Status: AC | PRN

## 2021-03-14 MED ORDER — PREDNISONE 20 MG PO TABS: 40.0000 mg | ORAL_TABLET | Freq: Every day | ORAL | 0 refills | Status: AC

## 2021-03-14 NOTE — ED Provider Notes (Signed)
[facility]    CSN: [id number] Arrival date & time: 03/14/21  1030      History   Chief Complaint Chief Complaint  Patient presents with   Cough    HPI Tony Franklin is a 45 y.o. male.   Patient presenting today with 2 to 3-day history of nasal congestion, sinus pressure, scratchy throat, hoarseness off and on, postnasal drip, cough.  Denies fever, chills, body aches, chest pain, shortness of breath, abdominal pain, nausea vomiting or diarrhea.  Has taken DayQuil, NyQuil with minimal temporary relief.  No known sick contacts recently.  No known history of chronic pertinent medical problems.   Past Medical History:  Diagnosis Date   Hyperlipidemia     Patient Active Problem List   Diagnosis Date Noted   Acute medial meniscus tear of left knee 07/26/2011   Loose body in knee 07/26/2011    Past Surgical History:  Procedure Laterality Date   ANKLE SURGERY     KNEE SURGERY         Home Medications    Prior to Admission medications   Medication Sig Start Date End Date Taking? Authorizing Provider  predniSONE (DELTASONE) 20 MG tablet Take 2 tablets (40 mg total) by mouth daily with breakfast. 03/14/21  Yes Particia Nearing, PA-C  promethazine-dextromethorphan (PROMETHAZINE-DM) 6.25-15 MG/5ML syrup Take 5 mLs by mouth 4 (four) times daily as needed. 03/14/21  Yes Particia Nearing, PA-C  azithromycin (ZITHROMAX) 250 MG tablet Zpack instructions. 500mg  day 1and 250mg  each day after until gone 03/06/19   A, PA-C  benzonatate (TESSALON) 100 MG capsule Take 1 capsule (100 mg total) by mouth every 8 (eight) hours. 03/06/19   Ronnie Doss A, PA-C  Celecoxib (CELEBREX PO) Take by mouth.    [provider]  famotidine (PEPCID) 40 MG tablet Take 1 tablet (40 mg total) by mouth daily. 03/17/15   Ronnie Doss, PA-C  ondansetron (ZOFRAN ODT) 4 MG disintegrating tablet 4mg  ODT q4 hours prn nausea/vomit 05/06/14   Ivery Quale, MD   ondansetron (ZOFRAN) 4 MG tablet Take 1 tablet (4 mg total) by mouth every 6 (six) hours. 03/06/19   07/05/14, PA-C  pantoprazole (PROTONIX) 20 MG tablet Take 1 tablet (20 mg total) by mouth daily. 03/17/15   14/3/20, PA-C  Rosuvastatin Calcium (CRESTOR PO) Take by mouth.    [provider]    Family History Family History  Problem Relation Age of Onset   Heart disease Other    Arthritis Other    Cancer Other     Social History Social History   Tobacco Use   Smoking status: Never   Smokeless tobacco: Never  Substance Use Topics   Alcohol use: No   Drug use: No     Allergies   Patient has no known allergies.   Review of Systems Review of Systems Per HPI  Physical Exam Triage Vital Signs ED Triage Vitals  Enc Vitals Group     BP 03/14/21 1250 122/84     Pulse Rate 03/14/21 1250 70     Resp 03/14/21 1250 20     Temp 03/14/21 1250 (!) 97.5 F (36.4 C)     Temp src --      SpO2 03/14/21 1250 96 %     Weight --      Height --      Head Circumference --      Peak Flow --      Pain  Score 03/14/21 1248 0     Pain Loc --      Pain Edu? --      Excl. in [place]? --    No data found.  Updated Vital Signs BP 122/84   Pulse 70   Temp (!) 97.5 F (36.4 C)   Resp 20   SpO2 96%   Visual Acuity Right Eye Distance:   Left Eye Distance:   Bilateral Distance:    Right Eye Near:   Left Eye Near:    Bilateral Near:     Physical Exam Vitals and nursing note reviewed.  Constitutional:      Appearance: He is well-developed.  HENT:     Head: Atraumatic.     Right Ear: External ear normal.     Left Ear: External ear normal.     Nose: Rhinorrhea present.     Mouth/Throat:     Pharynx: Posterior oropharyngeal erythema present. No oropharyngeal exudate.  Eyes:     Conjunctiva/sclera: Conjunctivae normal.     Pupils: Pupils are equal, round, and reactive to light.  Cardiovascular:     Rate and Rhythm: Normal rate and regular rhythm.   Pulmonary:     Effort: Pulmonary effort is normal. No respiratory distress.     Breath sounds: No wheezing or rales.  Musculoskeletal:        General: Normal range of motion.     Cervical back: Normal range of motion and neck supple.  Lymphadenopathy:     Cervical: No cervical adenopathy.  Skin:    General: Skin is warm and dry.  Neurological:     Mental Status: He is alert and oriented to person, place, and time.  Psychiatric:        Behavior: Behavior normal.     UC Treatments / Results  Labs (all labs ordered are listed, but only abnormal results are displayed) Labs Reviewed - No data to display  EKG   Radiology No results found.  Procedures Procedures (including critical care time)  Medications Ordered in UC Medications - No data to display  Initial Impression / Assessment and Plan / UC Course  I have reviewed the triage vital signs and the nursing notes.  Pertinent labs & imaging results that were available during my care of the patient were reviewed by me and considered in my medical decision making (see chart for details).     Consistent with viral upper respiratory infection, declines COVID and flu testing.  Treat symptomatically with prednisone, Phenergan DM, over-the-counter cold and congestion medications and home care.  Return for acutely worsening symptoms.  Work note given.  Final Clinical Impressions(s) / UC Diagnoses   Final diagnoses:  Viral URI with cough   Discharge Instructions   None    ED Prescriptions     Medication Sig Dispense Auth. Provider   predniSONE (DELTASONE) 20 MG tablet Take 2 tablets (40 mg total) by mouth daily with breakfast. 10 tablet Particia Nearing, PA-C   promethazine-dextromethorphan (PROMETHAZINE-DM) 6.25-15 MG/5ML syrup Take 5 mLs by mouth 4 (four) times daily as needed. 100 mL Particia Nearing, [place]      PDMP not reviewed this encounter.   Particia Nearing, [place] 03/14/21 1320

## 2021-03-14 NOTE — ED Triage Notes (Signed)
Covid test negative

## 2021-03-14 NOTE — ED Triage Notes (Signed)
Pt presents with cough and nasal congestion for past couple of days, unknown if fever

## 2022-06-29 ENCOUNTER — Ambulatory Visit (INDEPENDENT_AMBULATORY_CARE_PROVIDER_SITE_OTHER)

## 2022-06-29 ENCOUNTER — Ambulatory Visit
Admission: EM | Admit: 2022-06-29 | Discharge: 2022-06-29 | Disposition: A | Discharge status: Home / Self Care | Attending: Nurse Practitioner | Admitting: Nurse Practitioner

## 2022-06-29 DIAGNOSIS — S63601A Unspecified sprain of right thumb, initial encounter: Secondary | ICD-10-CM | POA: Diagnosis not present

## 2022-06-29 DIAGNOSIS — M79641 Pain in right hand: Secondary | ICD-10-CM | POA: Diagnosis not present

## 2022-06-29 NOTE — ED Provider Notes (Signed)
[facility]    CSN: [id number] Arrival date & time: 06/29/22  0957      History   Chief Complaint Chief Complaint  Patient presents with   Finger Injury    HPI Tony Franklin is a 47 y.o. male.   The history is provided by the patient.   Presents for complaints of pain to the thumb that occurred when he was playing basketball yesterday.  He states when he caught the ball, he caught it wrong, he is unsure of how he called at that time.  Since that time, he has noticed pain and swelling in the right thumb.  He states that when he went home, he did ice the thumb, and that it feels better today.  He denies bruising, numbness, tingling, or radiation of pain.  He states he also is able to move the thumb, but notices pain with the thumb.  Denies any previous injury to the right thumb.  Patient is right-hand dominant.  Past Medical History:  Diagnosis Date   Hyperlipidemia     Patient Active Problem List   Diagnosis Date Noted   Acute medial meniscus tear of left knee 07/26/2011   Loose body in knee 07/26/2011    Past Surgical History:  Procedure Laterality Date   ANKLE SURGERY     KNEE SURGERY         Home Medications    Prior to Admission medications   Medication Sig Start Date End Date Taking? Authorizing Provider  azithromycin (ZITHROMAX) 250 MG tablet Zpack instructions. 500mg  day 1and 250mg  each day after until gone 03/06/19   Madilyn Hook A, PA-C  benzonatate (TESSALON) 100 MG capsule Take 1 capsule (100 mg total) by mouth every 8 (eight) hours. 03/06/19   Madilyn Hook A, PA-C  Celecoxib (CELEBREX PO) Take by mouth.    [provider]  famotidine (PEPCID) 40 MG tablet Take 1 tablet (40 mg total) by mouth daily. 03/17/15   Lily Kocher, PA-C  ondansetron (ZOFRAN ODT) 4 MG disintegrating tablet 4mg  ODT q4 hours prn nausea/vomit 05/06/14   Milton Ferguson, MD  ondansetron (ZOFRAN) 4 MG tablet Take 1 tablet (4 mg total) by mouth every 6 (six)  hours. 03/06/19   Alveria Apley, PA-C  pantoprazole (PROTONIX) 20 MG tablet Take 1 tablet (20 mg total) by mouth daily. 03/17/15   Lily Kocher, PA-C  predniSONE (DELTASONE) 20 MG tablet Take 2 tablets (40 mg total) by mouth daily with breakfast. 03/14/21   Volney American, PA-C  promethazine-dextromethorphan (PROMETHAZINE-DM) 6.25-15 MG/5ML syrup Take 5 mLs by mouth 4 (four) times daily as needed. 03/14/21   Volney American, PA-C  Rosuvastatin Calcium (CRESTOR PO) Take by mouth.    [provider]    Family History Family History  Problem Relation Age of Onset   Heart disease Other    Arthritis Other    Cancer Other     Social History Social History   Tobacco Use   Smoking status: Never   Smokeless tobacco: Never  Substance Use Topics   Alcohol use: No   Drug use: Never     Allergies   Patient has no known allergies.   Review of Systems Review of Systems Per HPI  Physical Exam Triage Vital Signs ED Triage Vitals  Enc Vitals Group     BP 06/29/22 1001 128/85     Pulse Rate 06/29/22 1001 65     Resp 06/29/22 1001 16     Temp 06/29/22  1001 (!) 97.4 F (36.3 C)     Temp Source 06/29/22 1001 Oral     SpO2 06/29/22 1001 96 %     Weight --      Height --      Head Circumference --      Peak Flow --      Pain Score 06/29/22 1004 4     Pain Loc --      Pain Edu? --      Excl. in [place]? --    No data found.  Updated Vital Signs BP 128/85 (BP Location: Right Arm)   Pulse 65   Temp (!) 97.4 F (36.3 C) (Oral)   Resp 16   SpO2 96%   Visual Acuity Right Eye Distance:   Left Eye Distance:   Bilateral Distance:    Right Eye Near:   Left Eye Near:    Bilateral Near:     Physical Exam Vitals and nursing note reviewed.  Constitutional:      General: He is not in acute distress.    Appearance: Normal appearance.  Eyes:     Extraocular Movements: Extraocular movements intact.     Pupils: Pupils are equal, round, and reactive to  light.  Pulmonary:     Effort: Pulmonary effort is normal.  Musculoskeletal:     Right hand: Swelling and tenderness (Tenderness noted to the thenar eminence of the right thumb/hand.) present. No deformity. Normal range of motion. There is no disruption of two-point discrimination. Normal capillary refill. Normal pulse.     Cervical back: Normal range of motion.  Skin:    General: Skin is warm and dry.  Neurological:     General: No focal deficit present.     Mental Status: He is alert and oriented to person, place, and time.  Psychiatric:        Mood and Affect: Mood normal.        Behavior: Behavior normal.      UC Treatments / Results  Labs (all labs ordered are listed, but only abnormal results are displayed) Labs Reviewed - No data to display  EKG   Radiology DG Hand Complete Right  Result Date: 06/29/2022 CLINICAL DATA:  Trauma, pain EXAM: RIGHT HAND - COMPLETE 3+ VIEW COMPARISON:  06/13/2018 FINDINGS: No fracture or dislocation is seen. There are no opaque foreign bodies. No significant interval changes are noted. IMPRESSION: No fracture or dislocation is seen in right hand. Electronically Signed   By: Elmer Picker M.D.   On: 06/29/2022 10:19    Procedures Procedures (including critical care time)  Medications Ordered in UC Medications - No data to display  Initial Impression / Assessment and Plan / UC Course  I have reviewed the triage vital signs and the nursing notes.  Pertinent labs & imaging results that were available during my care of the patient were reviewed by me and considered in my medical decision making (see chart for details).  The patient is well-appearing, he is in no acute distress, vital signs are stable.  X-ray of the right hand is negative for fracture or dislocation, specifically the right thumb.  Patient with pain in the thenar eminence of the right hand.  Suspect a sprain of the right thumb.  Supportive care recommendations were  provided and discussed with the patient to include over-the-counter analgesics for pain or discomfort, continuing to apply ice, and gentle range of motion exercises.  Patient was given indications of when follow-up may be indicated.  Patient is in agreement with this plan of care and verbalizes understanding.  All questions were answered.  Patient stable for discharge.  Final Clinical Impressions(s) / UC Diagnoses   Final diagnoses:  Sprain of right thumb, unspecified site of digit, initial encounter     Discharge Instructions      The x-ray is negative for fracture or dislocation.  Symptoms appear to be consistent with a sprain of the right thumb. May take over-the-counter Tylenol or ibuprofen as needed for pain, fever, general discomfort. Continue to apply ice to the right thumb.  Apply for 20 minutes, remove for 1 hour, then repeat is much as possible to help decrease swelling and pain. Gentle range of motion exercises to help decrease her recovery time. Normal activities as tolerated. As discussed, if symptoms do not improve over the next week develop new symptoms, recommend that you follow-up with your primary care physician or with orthopedics for further evaluation. Follow-up as needed.     ED Prescriptions   None    PDMP not reviewed this encounter.   Tish Men, NP 06/29/22 1118

## 2022-06-29 NOTE — ED Triage Notes (Signed)
Pt reports he injured the right thumb yesterday playing basketball. Reports pain and swelling.

## 2022-06-29 NOTE — Discharge Instructions (Addendum)
The x-ray is negative for fracture or dislocation.  Symptoms appear to be consistent with a sprain of the right thumb. May take over-the-counter Tylenol or ibuprofen as needed for pain, fever, general discomfort. Continue to apply ice to the right thumb.  Apply for 20 minutes, remove for 1 hour, then repeat is much as possible to help decrease swelling and pain. Gentle range of motion exercises to help decrease her recovery time. Normal activities as tolerated. As discussed, if symptoms do not improve over the next week develop new symptoms, recommend that you follow-up with your primary care physician or with orthopedics for further evaluation. Follow-up as needed.

## 2023-05-16 ENCOUNTER — Encounter: Payer: Self-pay | Admitting: Internal Medicine

## 2023-05-16 DIAGNOSIS — G4733 Obstructive sleep apnea (adult) (pediatric): Secondary | ICD-10-CM

## 2023-05-23 NOTE — Procedures (Signed)
 [facility]  Polysomnogram Report Part I                                                               Phone: [phone] Fax: [phone]  Patient Name: Tony Franklin, Tony Franklin Acquisition Number: [id number]  Date of Birth: 07/02/1975 Acquisition Date: 05/16/2023  Referring Physician: Darreld Mclean MD     History: The patient is a 48 year old  who was referred for evaluation of . Medical History: Erectile dysfunction, prediabetes, hyperlipidemia.  Medications: rosavastatin, codeine, amoxicillin, sertraline.  Procedure: This routine overnight polysomnogram was performed on the [facility] 5 using the standard diagnostic protocol. This included 6 channels of EEG, 2 channels of EOG, chin EMG, bilateral anterior tibialis EMG, nasal/oral thermistor, PTAF (nasal pressure transducer), chest and abdominal wall movements, EKG, and pulse oximetry.  Description: The total recording time was 433.0 minutes. The total sleep time was 404.5 minutes. There were a total of 25.5 minutes of wakefulness after sleep onset for a goodsleep efficiency of 93.4%. The latency to sleep onset was shortat 3.0 minutes. The R sleep onset latency was within normal limits at 72.5 minutes. Sleep parameters, as a percentage of the total sleep time, demonstrated 3.0% of sleep was in N1 sleep, 80.0% N2, 0.6% N3 and 16.4% R sleep. There were a total of 53 arousals for an arousal index of 7.9 arousals per hour of sleep that was normal.  Respiratory monitoring demonstrated   snoring . Only 6 respiratory events were observed. The baseline oxygen saturation during wakefulness was 96%, during NREM sleep averaged 96%, and during REM sleep averaged  96%. The total duration of oxygen < 90% was 2.0 minutes.  Cardiac monitoring-  significant cardiac rhythm irregularities.   Periodic limb movement monitoring- did not demonstrate periodic limb movements.   Impression: This routine overnight polysomnogram did not demonstrate significant  obstructive sleep apnea with only 6 respiratory events observed.  Despite reporting a history of poor quality sleep, sleep efficiency was good and sleep latency was short at 3 minutes. Increased awakeningsand reduced percentages of REM and slow wave sleep were observed. The patient also reports an irregular sleep schedule with daytime napping.    Recommendations:     Sleep hygiene recommendations should be reviewed. The patient may benefit from cognitive behavioral therapy for insomnia. This can be done with a mental health professional or via web-based programs. Would recommend weight loss in a patient with a BMI of 29.7.     Yevonne Pax, MD, [facility] Diplomate ABMS-Pulmonary, Critical Care and Sleep Medicine  Electronically reviewed and digitally signed  [facility] Polysomnogram Report Part Franklin  Phone: [phone] Fax: [phone]  Patient last name Pursell Franklin Neck Size 16   in. Acquisition [id number]  Patient first name Tony Weight 244.0 lbs. Started 05/16/2023 at 10:09:32 PM  Birth date [birth year] Height 76.0 in. Stopped 05/17/2023 at 5:34:50 AM  Age 80 BMI 29.7 lb/in2 Duration 433.0  Study Type Adult      Robbi Garter RPSGT. / Loura Back  Reviewed by: Valentino Hue. Henke, PhD, ABSM, FAASM Sleep Data: Lights Out: 10:18:32 PM Sleep Onset: 10:21:32 PM  Lights On: 5:31:32 AM Sleep Efficiency: 93.4 %  Total Recording Time: 433.0 min Sleep Latency (from Lights Off) 3.0 min  Total Sleep Time (TST): 404.5 min R Latency (from Sleep Onset): 72.5 min  Sleep Period Time: 429.5 min Total number of awakenings: 19  Wake during sleep: 25.0 min Wake After Sleep Onset (WASO): 25.5 min   Sleep Data:         Arousal Summary: Stage  Latency from lights out (min) Latency from sleep onset (min) Duration (min) % Total Sleep Time  Normal values  N 1 3.0 0.0 12.0 3.0 (5%)  N 2 3.5 0.5 323.5 80.0 (50%)  N 3 27.0 24.0 2.5 0.6 (20%)  R 75.5 72.5 66.5 16.4 (25%)   Number Index   Spontaneous 54 8.0  Apneas & Hypopneas 1 0.1  RERAs 0 0.0       (Apneas & Hypopneas & RERAs)  (1) (0.1)  Limb Movement 0 0.0  Snore 0 0.0  TOTAL 55 8.2     Respiratory Data:  CA OA MA Apnea Hypopnea* A+ H RERA Total  Number 0 0 0 0 6 6 0 6  Mean Dur (sec) 0.0 0.0 0.0 0.0 14.3 14.3 0.0 14.3  Max Dur (sec) 0.0 0.0 0.0 0.0 18.5 18.5 0.0 18.5  Total Dur (min) 0.0 0.0 0.0 0.0 1.4 1.4 0.0 1.4  % of TST 0.0 0.0 0.0 0.0 0.4 0.4 0.0 0.4  Index (#/h TST) 0.0 0.0 0.0 0.0 0.9 0.9 0.0 0.9  *Hypopneas scored based on 4% or greater desaturation.  Sleep Stage:        REM NREM TST  AHI 0.9 0.9 0.9  RDI 0.9 0.9 0.9           Body Position Data:  Sleep (min) TST (%) REM (min) NREM (min) CA (#) OA (#) MA (#) HYP (#) AHI (#/h) RERA (#) RDI (#/h) Desat (#)  Supine 65.0 16.07 8.0 57.0 0 0 0 3 2.8 0 2.8 9  Non-Supine 339.50 83.93 58.50 281.00 0.00 0.00 0.00 3.00 0.53 0 0.53 11.00  Left: 151.5 37.45 48.5 103.0 0 0 0 3 1.2 0 1.2 8  Right: 188.0 46.48 10.0 178.0 0 0 0 0 0.0 0 0.00 3     Snoring: Total number of snoring episodes  0  Total time with snoring    min (   % of sleep)   Oximetry Distribution:             WK REM NREM TOTAL  Average (%)   96 96 96 96  < 90% 0.0 0.0 2.0 2.0  < 80% 0.0 0.0 0.0 0.0  < 70% 0.0 0.0 0.0 0.0  # of Desaturations* 2 4 14 20   Desat Index (#/hour) 4.4 3.6 2.5 3.0  Desat Max (%) 4 5 6 6   Desat Max Dur (sec) 21.0 22.0 102.0 102.0  Approx Min O2 during sleep 73  Approx min O2 during a respiratory event 88  Was Oxygen added (Y/N) and final rate :    LPM  *Desaturations based on 3% or greater drop from baseline.   Cheyne Stokes Breathing: None Present   Heart Rate Summary:  Average Heart Rate During Sleep 56.6 bpm      Highest Heart Rate During Sleep (95th %) 64.0 bpm      Highest Heart Rate During Sleep 110 bpm      Highest Heart Rate During Recording (TIB) 210 bpm (artifact)   Heart Rate Observations: Event Type # Events    Bradycardia 0 Lowest HR Scored: N/A  Sinus Tachycardia During Sleep 0 Highest HR Scored: N/A  Narrow  Complex Tachycardia 0 Highest HR Scored: N/A  Wide Complex Tachycardia 0 Highest HR Scored: N/A  Asystole 0 Longest Pause: N/A  Atrial Fibrillation 0 Duration Longest Event: N/A  Other Arrythmias   Type:    Periodic Limb Movement Data: (Primary legs unless otherwise noted) Total # Limb Movement 0 Limb Movement Index 0.0  Total # PLMS    PLMS Index     Total # PLMS Arousals    PLMS Arousal Index     Percentage Sleep Time with PLMS   min (   % sleep)  Mean Duration limb movements (secs)

## 2023-10-31 ENCOUNTER — Ambulatory Visit: Admitting: Urology

## 2023-11-20 ENCOUNTER — Ambulatory Visit (INDEPENDENT_AMBULATORY_CARE_PROVIDER_SITE_OTHER): Admitting: Urology

## 2023-11-20 ENCOUNTER — Other Ambulatory Visit
Admission: RE | Admit: 2023-11-20 | Discharge: 2023-11-20 | Disposition: A | Discharge status: Home / Self Care | Attending: Urology | Admitting: Urology

## 2023-11-20 VITALS — BP 144/96 | HR 65 | Wt 251.0 lb

## 2023-11-20 DIAGNOSIS — Z125 Encounter for screening for malignant neoplasm of prostate: Secondary | ICD-10-CM

## 2023-11-20 DIAGNOSIS — N529 Male erectile dysfunction, unspecified: Secondary | ICD-10-CM

## 2023-11-20 MED ORDER — TADALAFIL 5 MG PO TABS: 5.0000 mg | ORAL_TABLET | Freq: Every day | ORAL | 11 refills | Status: DC

## 2023-11-20 NOTE — Progress Notes (Signed)
   11/20/23 9:07 AM   Tony Franklin 08-22-1975 [id number]  CC: ED, discuss PSA screening  HPI: Healthy 48 year old male who would like to discuss ED and PSA screening.  There are no prior PSA values to review.  He thinks he may have a family history of breast cancer in his mother.  No family history of prostate cancer.  He denies any urinary symptoms.  He also reports some new problems with the ED after being away from his wife for a while, he thinks this could be stress related.  He has never tried any medications for this.  In the   PMH: Past Medical History:  Diagnosis Date   Hyperlipidemia     Surgical History: Past Surgical History:  Procedure Laterality Date   ANKLE SURGERY     KNEE SURGERY      Family History: Family History  Problem Relation Age of Onset   Heart disease Other    Arthritis Other    Cancer Other     Social History:  reports that he has never smoked. He has never used smokeless tobacco. He reports that he does not drink alcohol and does not use drugs.  Physical Exam: BP (!) 144/96 (BP Location: Left Arm, Patient Position: Sitting, Cuff Size: Large)   Pulse 65   Wt 251 lb (113.9 kg)   SpO2 96%   BMI 32.23 kg/m    Constitutional:  Alert and oriented, No acute distress. Cardiovascular: No clubbing, cyanosis, or edema. Respiratory: Normal respiratory effort, no increased work of breathing. GI: Abdomen is soft, nontender, nondistended, no abdominal masses  Laboratory Data: None to review  Assessment & Plan:   48 year old healthy male with new ED that he feels could be stress related, as well as questions about PSA screening.  In terms of ED, we discussed erections are multifactorial, and we reviewed the concept of psychogenic ED.  I also recommended checking a testosterone per the guideline recommendations.  I think it is very reasonable to trial 5 mg daily Cialis  for the next month and see how he does, may not need this medication long-term,  we discussed max dose would be 20 mg prior to sexual activity, and risks and benefits, side effects were discussed.  In terms of PSA screening, we reviewed the guidelines regarding the risks and benefits of screening, with his family history of breast cancer and his age very reasonable to start screening.  He denies any ejaculations in the last 2 to 3 days.  Trial of Cialis  5 mg daily Check testosterone today, call with results PSA today, call with results  Redell Burnet, MD 11/20/2023  [facility] [address] [place], [place] [address] [phone]

## 2023-11-20 NOTE — Patient Instructions (Signed)
 Prostate Cancer Screening  Prostate cancer screening is testing that is done to check for the presence of prostate cancer in men. The prostate gland is a walnut-sized gland that is located below the bladder and in front of the rectum in males. The function of the prostate is to add fluid to semen during ejaculation. Prostate cancer is one of the most common types of cancer in men. Who should have prostate cancer screening? Screening recommendations vary based on age and other risk factors, as well as between the professional organizations who make the recommendations. In general, screening is recommended if: You are age 50 to 100 and have an average risk for prostate cancer. You should talk with your health care provider about your need for screening and how often screening should be done. Because most prostate cancers are slow growing and will not cause death, screening in this age group is generally reserved for men who have a 10- to 15-year life expectancy. You are younger than age 78, and you have these risk factors: Having a father, brother, or uncle who has been diagnosed with prostate cancer. The risk is higher if your family member's cancer occurred at an early age or if you have multiple family members with prostate cancer at an early age. Being a male who is [place] or is of [place] or sub-Saharan African descent. In general, screening is not recommended if: You are younger than age 50. You are between the ages of 50 and 50 and you have no risk factors. You are 50 years of age or older. At this age, the risks that screening can cause are greater than the benefits that it may provide. If you are at high risk for prostate cancer, your health care provider may recommend that you have screenings more often or that you start screening at a younger age. How is screening for prostate cancer done? The recommended prostate cancer screening test is a blood test called the prostate-specific antigen  (PSA) test. PSA is a protein that is made in the prostate. As you age, your prostate naturally produces more PSA. Abnormally high PSA levels may be caused by: Prostate cancer. An enlarged prostate that is not caused by cancer (benign prostatic hyperplasia, or BPH). This condition is very common in older men. A prostate gland infection (prostatitis) or urinary tract infection. Certain medicines such as male hormones (like testosterone) or other medicines that raise testosterone levels. A rectal exam may be done as part of prostate cancer screening to help provide information about the size of your prostate gland. When a rectal exam is performed, it should be done after the PSA level is drawn to avoid any effect on the results. Depending on the PSA results, you may need more tests, such as:  Blood and imaging tests. A procedure to remove tissue samples from your prostate gland for testing (biopsy). This is the only way to know for certain if you have prostate cancer. What are the benefits of prostate cancer screening? Screening can help to identify cancer at an early stage, before symptoms start and when the cancer can be treated more easily. There is a small chance that screening may lower your risk of dying from prostate cancer. The chance is small because prostate cancer is a slow-growing cancer, and most men with prostate cancer die from a different cause. What are the risks of prostate cancer screening? The main risk of prostate cancer screening is diagnosing and treating prostate cancer that would never have caused  any symptoms or problems. This is called overdiagnosisand overtreatment. PSA screening cannot tell you if your PSA is high due to cancer or a different cause. A prostate biopsy is the only procedure to diagnose prostate cancer. Even the results of a biopsy may not tell you if your cancer needs to be treated. Slow-growing prostate cancer may not need any treatment other than monitoring,  so diagnosing and treating it may cause unnecessary stress or other side effects. Questions to ask your health care provider When should I start prostate cancer screening? What is my risk for prostate cancer? How often do I need screening? What type of screening tests do I need? How do I get my test results? What do my results mean? Do I need treatment? Where to find more information The American Cancer Society: www.cancer.org American Urological Association: www.auanet.org Contact a health care provider if: You have difficulty urinating. You have pain when you urinate or ejaculate. You have blood in your urine or semen. You have pain in your back or in the area of your prostate. Summary Prostate cancer is a common type of cancer in men. The prostate gland is located below the bladder and in front of the rectum. This gland adds fluid to semen during ejaculation. Prostate cancer screening may identify cancer at an early stage, when the cancer can be treated more easily and is less likely to have spread to other areas of the body. The prostate-specific antigen (PSA) test is the recommended screening test for prostate cancer, but it has associated risks. Discuss the risks and benefits of prostate cancer screening with your health care provider. If you are age 50 or older, the risks that screening can cause are greater than the benefits that it may provide. This information is not intended to replace advice given to you by your health care provider. Make sure you discuss any questions you have with your health care provider. Document Revised: 09/13/2020 Document Reviewed: 09/13/2020 Elsevier Patient Education  2024 [employer].

## 2023-11-21 LAB — PSA, TOTAL AND FREE
PSA, Free Pct: 33.3 %
PSA, Free: 0.1 ng/mL
Prostate Specific Ag, Serum: 0.3 ng/mL (ref 0.0–4.0)

## 2023-11-27 ENCOUNTER — Telehealth: Payer: Self-pay

## 2023-11-27 ENCOUNTER — Ambulatory Visit: Payer: Self-pay | Admitting: Urology

## 2023-11-27 NOTE — Telephone Encounter (Signed)
 Received secure

## 2023-11-27 NOTE — Telephone Encounter (Signed)
 Received secure chat from Dr. Francisca questioning where the results of patient's testosterone  were. Called [facility] [place], spoke with Sherri who states that they drew the lab but sent it to [facility]. She called [facility] who states that there was a code issue which delayed the specimen from being processed. [facility] states they will process now.

## 2023-11-28 ENCOUNTER — Other Ambulatory Visit: Payer: Self-pay

## 2023-11-28 DIAGNOSIS — N529 Male erectile dysfunction, unspecified: Secondary | ICD-10-CM

## 2023-11-28 LAB — TESTOSTERONE, FREE: Testosterone, Free: 7 pg/mL — ABNORMAL LOW (ref 7.2–24.0)

## 2023-12-06 ENCOUNTER — Other Ambulatory Visit

## 2023-12-27 ENCOUNTER — Telehealth: Payer: Self-pay

## 2023-12-27 DIAGNOSIS — N529 Male erectile dysfunction, unspecified: Secondary | ICD-10-CM

## 2023-12-27 MED ORDER — TADALAFIL 5 MG PO TABS: 5.0000 mg | ORAL_TABLET | Freq: Every day | ORAL | 3 refills | Status: AC

## 2023-12-27 NOTE — Telephone Encounter (Signed)
 Pt requested RX be sent to mail order pharmacy, [facility]
